# Patient Record
Sex: Male | Born: 1970 | Race: White | Hispanic: No | Marital: Single | Smoking: Current every day smoker
Health system: Southern US, Community
[De-identification: ages and names within clinical notes are randomized; demographics above are authoritative.]

## PROBLEM LIST (undated history)

## (undated) DIAGNOSIS — E7800 Pure hypercholesterolemia, unspecified: Secondary | ICD-10-CM

## (undated) DIAGNOSIS — M459 Ankylosing spondylitis of unspecified sites in spine: Secondary | ICD-10-CM

## (undated) HISTORY — PX: ROTATOR CUFF REPAIR: SHX139

## (undated) HISTORY — PX: KNEE SURGERY: SHX244

---

## 2005-04-21 ENCOUNTER — Emergency Department: Payer: Self-pay | Admitting: General Practice

## 2012-09-25 ENCOUNTER — Emergency Department: Payer: Self-pay | Admitting: Emergency Medicine

## 2012-11-07 ENCOUNTER — Ambulatory Visit: Payer: Self-pay | Admitting: Orthopedic Surgery

## 2012-11-11 ENCOUNTER — Ambulatory Visit: Payer: Self-pay | Admitting: Orthopedic Surgery

## 2013-09-15 ENCOUNTER — Ambulatory Visit: Payer: Self-pay | Admitting: Internal Medicine

## 2014-05-14 NOTE — Op Note (Signed)
PATIENT NAME:  Ian Fischer, Ian Fischer MR#:  [id number] DATE OF BIRTH:  28-May-1970  DATE OF PROCEDURE:  11/11/2012  PREOPERATIVE DIAGNOSIS:  Right shoulder rotator cuff tear, biceps tenotomy, and impingement syndrome.  POSTOPERATIVE DIAGNOSIS:  Right shoulder rotator cuff tear, biceps tenotomy, and impingement syndrome.  PROCEDURE PERFORMED:  Arthroscopic repair of rotator cuff including subscapularis repair. Subacromial decompression. Extensive synovectomy. Biceps tenotomy.   SURGEON:  Murlean Hark, M.D.   ASSISTANT:  Sonny Dandy, PA.   ANESTHESIA:  Interscalene block and general anesthesia.   ESTIMATED BLOOD LOSS:  Minimal.  IMPLANTS USED:  Arthrex bead bridge set, Arthrex BioComposite SwiveLock, suture anchor and 4.75 mm.   SURGICAL FINDINGS:  Arthroscopy of the right shoulder revealed full-thickness tear of the supraspinatus and anterior one-half of the infraspinatus. A high-grade partial thickness tear of the subscapularis was also noted. Significant inflammation and fraying of the biceps tendon was noted. Extensive bursitis with a large spur was noted in the subacromial space.   COMPLICATIONS:  No immediate postoperative or intraoperative or postoperative complications were noted.   INDICATIONS FOR PROCEDURE:  Ian Fischer is a 44 year old gentleman who sustained an injury to the right shoulder while at work. Evaluation with MRI and physical examination demonstrates a full-thickness rotator cuff tear. The risks and benefits of surgery, as well as the expected course of rehabilitation were explained to the patient in the office. The patient agrees to proceed with the procedure.   DESCRIPTION OF PROCEDURE:  Ian Fischer was identified in the preoperative holding area. The right shoulder was identified and marked as the operative site. Informed consent was reviewed. Questions from the patient and his wife were answered.   The patient was brought into the Operating Room, and  interscalene block was administered. The patient was placed on the Operating Room table in a supine position. General anesthesia was administered. The patient was placed in a beach chair position with padding of appropriate prominences and adequate securing of the head and body. The right upper extremity was prepared and draped in the usual sterile fashion.   A surgical time-out performed.   A standard posterior viewing portal was made. Arthroscope was inserted. Under direct visualization, an anterior portal was made in the rotator interval. A probe was inserted anteriorly and the biceps tendon was drawn into the joint. The biceps tendon demonstrated significant fraying and significant synovitis. The subscapularis was evaluated. There was a very high-grade tear of the subscapularis tendon. There was some scarring of the anterior capsule of the shoulder. The supraspinatus and infraspinatus were now evaluated. They demonstrated a full-thickness tear of the supraspinatus with retraction to the midportion of the humeral head. There was a full-thickness tear of the anterior half of the infraspinatus. The teres minor were appeared intact. The axillary recess was evaluated and no loose bodies were identified. The labrum was evaluated and had some mild fraying but no of acute tearing.   A lateral portal was made directly to the lateral rotator cuff tear. Through the anterior portal, a cannula was placed. Using a lasso, a FiberTape stitch is passed through a healthy portion of the subscapularis tendon after tendon debridement of the torn portion is undertaken. A horizontal mattress stitch is passed. A 4.75 SwiveLock anchor is inserted into the proximal humerus in line with the subscapularis footprint. The subscapularis was nicely reduced to the footprint and the BioComposite SwiveLock is completely deployed and tightened.   Attention was now turned to the subacromial space. There is quite extensive bursitis.  A shaver  was inserted laterally and a very extensive bursectomy is carried out. The rotator cuff tear is readily identified. The humeral footprint was cleaned. The patient does have an extensive history of smoking. Multiple microfracture pores are made in  the humeral surface after a burr used to expose healthier bone. Arthroscopic flow is turned off and there is some bleeding achieved.   An acromionizer burr is inserted and an extensive acromioplasty is carried out.   At this time, an anterior medial anchor is placed and a spliced FiberTape suture is passed through the anterior portion of the cuff. A posterior medial anchor is placed in a similar fashion and a second spliced FiberTape is passed through the posterior one-half of the tendon. The central core stitch at the posterior anchor is then passed in a horizontal mattress fashion through the posterior-most aspect of the cuff. All sutures are retrieved through the anterior portal. The spliced suture ends are cut. One limb from each of the FiberTape sutures is taken back out through the lateral portal, and fed through a SwiveLock anchor. The SwiveLock anchor is deployed in  the anterior half of the lateral row. In a similar fashion, the posterior lateral row anchor is passed using the residual two FiberTape limbs. The second anchor did not appear to achieve sure enough purchase and therefore the anchor was changed to a 5.5 mm width anchor and excellent purchase was achieved. The posterior horizontal mattress stitch was tied, nicely reducing the posterior-most aspect of the cuff. At this time the shoulder was taken through a range of motion and the rotator cuff was found to be very nicely and securely reduced.   The arthroscope was inserted back into the glenohumeral joint for further evaluation of cuff repair. The cuff integrity was found to be intact of both the subscapularis and the supraspinatus/infraspinatus repair.   Irrigation was run through the shoulder  and the shoulder was drained. All instruments were removed. The portals were closed using 2-0 Vicryl and nylon suture. Sterile dressings were applied. A TENS unit was applied. Polar Care was applied. The patient was placed in a slingshot sling. He will begin physical therapy on Friday. He will follow up in the office this week.   ____________________________ Murlean HarkShalini Katelynd Blauvelt, MD sr:jm D: 11/14/2012 15:44:33 ET T: 11/14/2012 16:30:44 ET JOB#: [id number]  cc: Murlean HarkShalini Khalib Fendley, MD, <Dictator> Murlean HarkSHALINI Maybelle Depaoli MD ELECTRONICALLY SIGNED 11/27/2012 12:40

## 2014-07-28 IMAGING — CR DG SHOULDER 3+V*R*
1 series · 3 of 3 positions shown · non-contrast
Comparison: none

REASON FOR EXAM: pain
COMMENTS:

PROCEDURE:     DXR - DXR SHOULDER RIGHT COMPLETE  - September 25, 2012  [DATE]
RESULT:     Right shoulder images demonstrate no evidence of fracture,
dislocation or radiopaque foreign body.

[Series 1: w shoulder external right · 0.14mm/px · 3 of 3 slices shown]
[im 1/3]
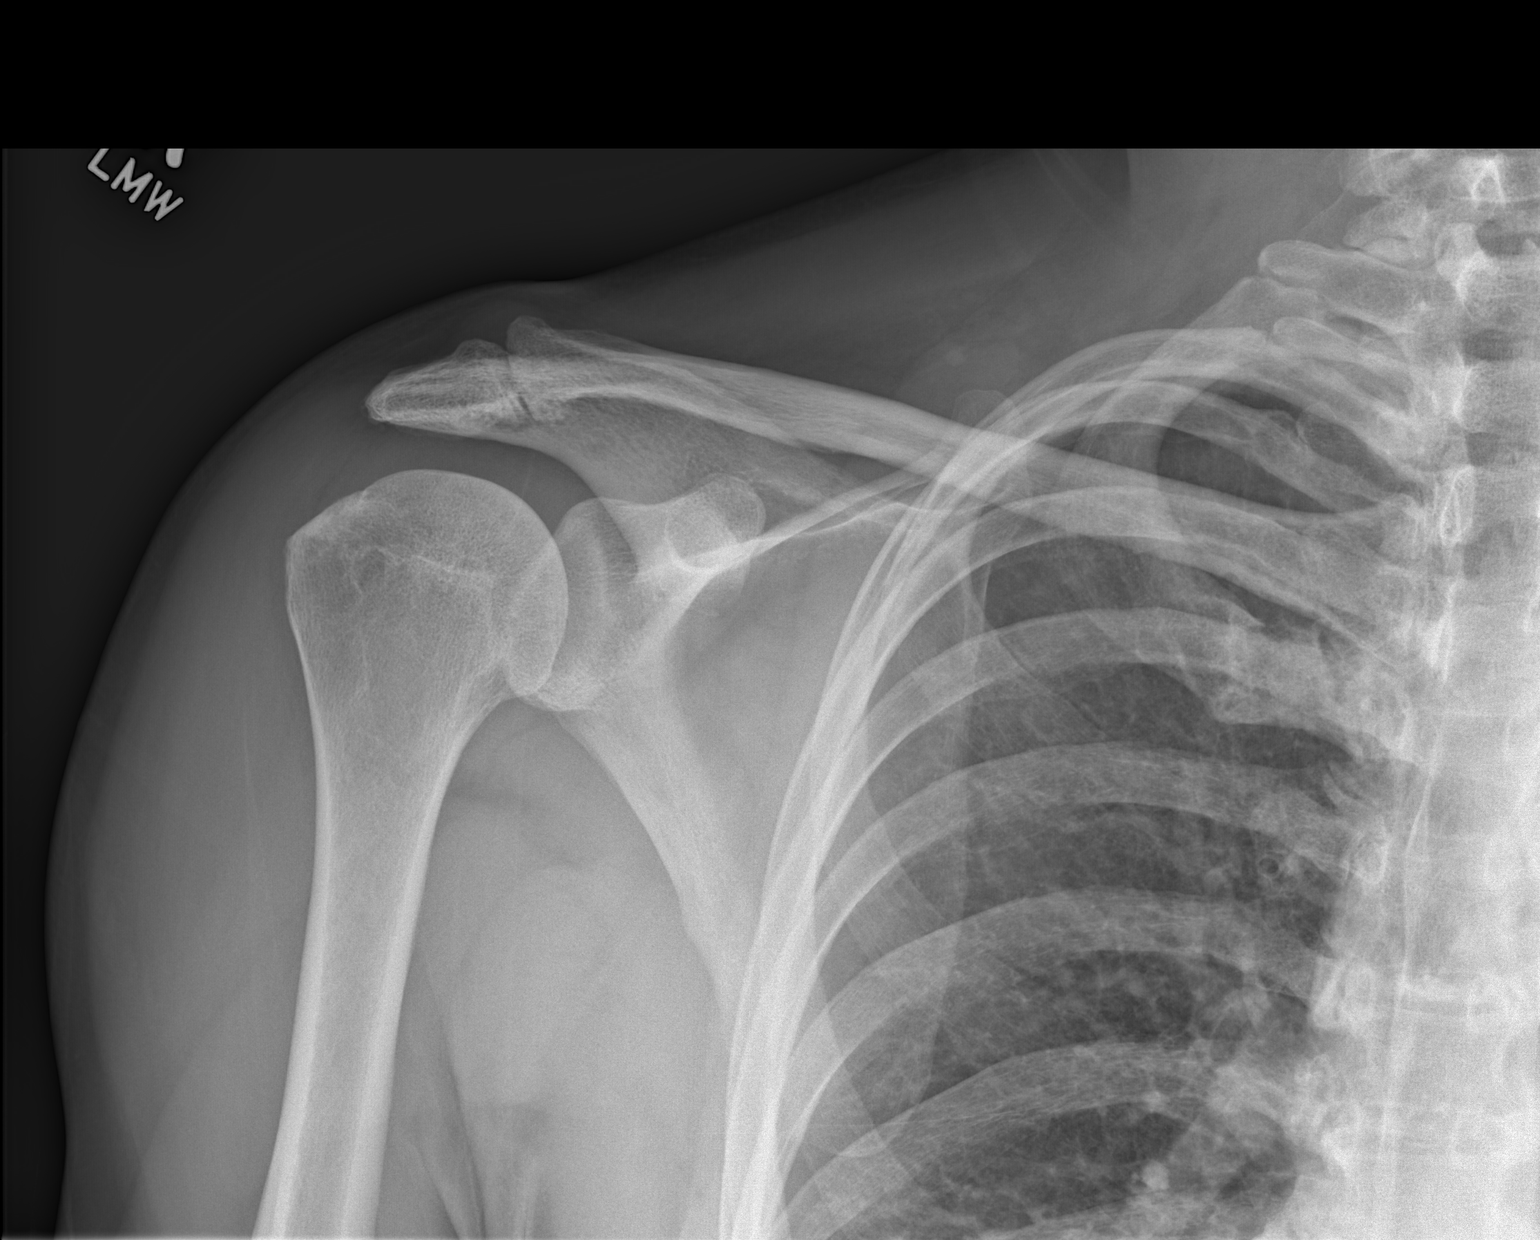
[im 2/3]
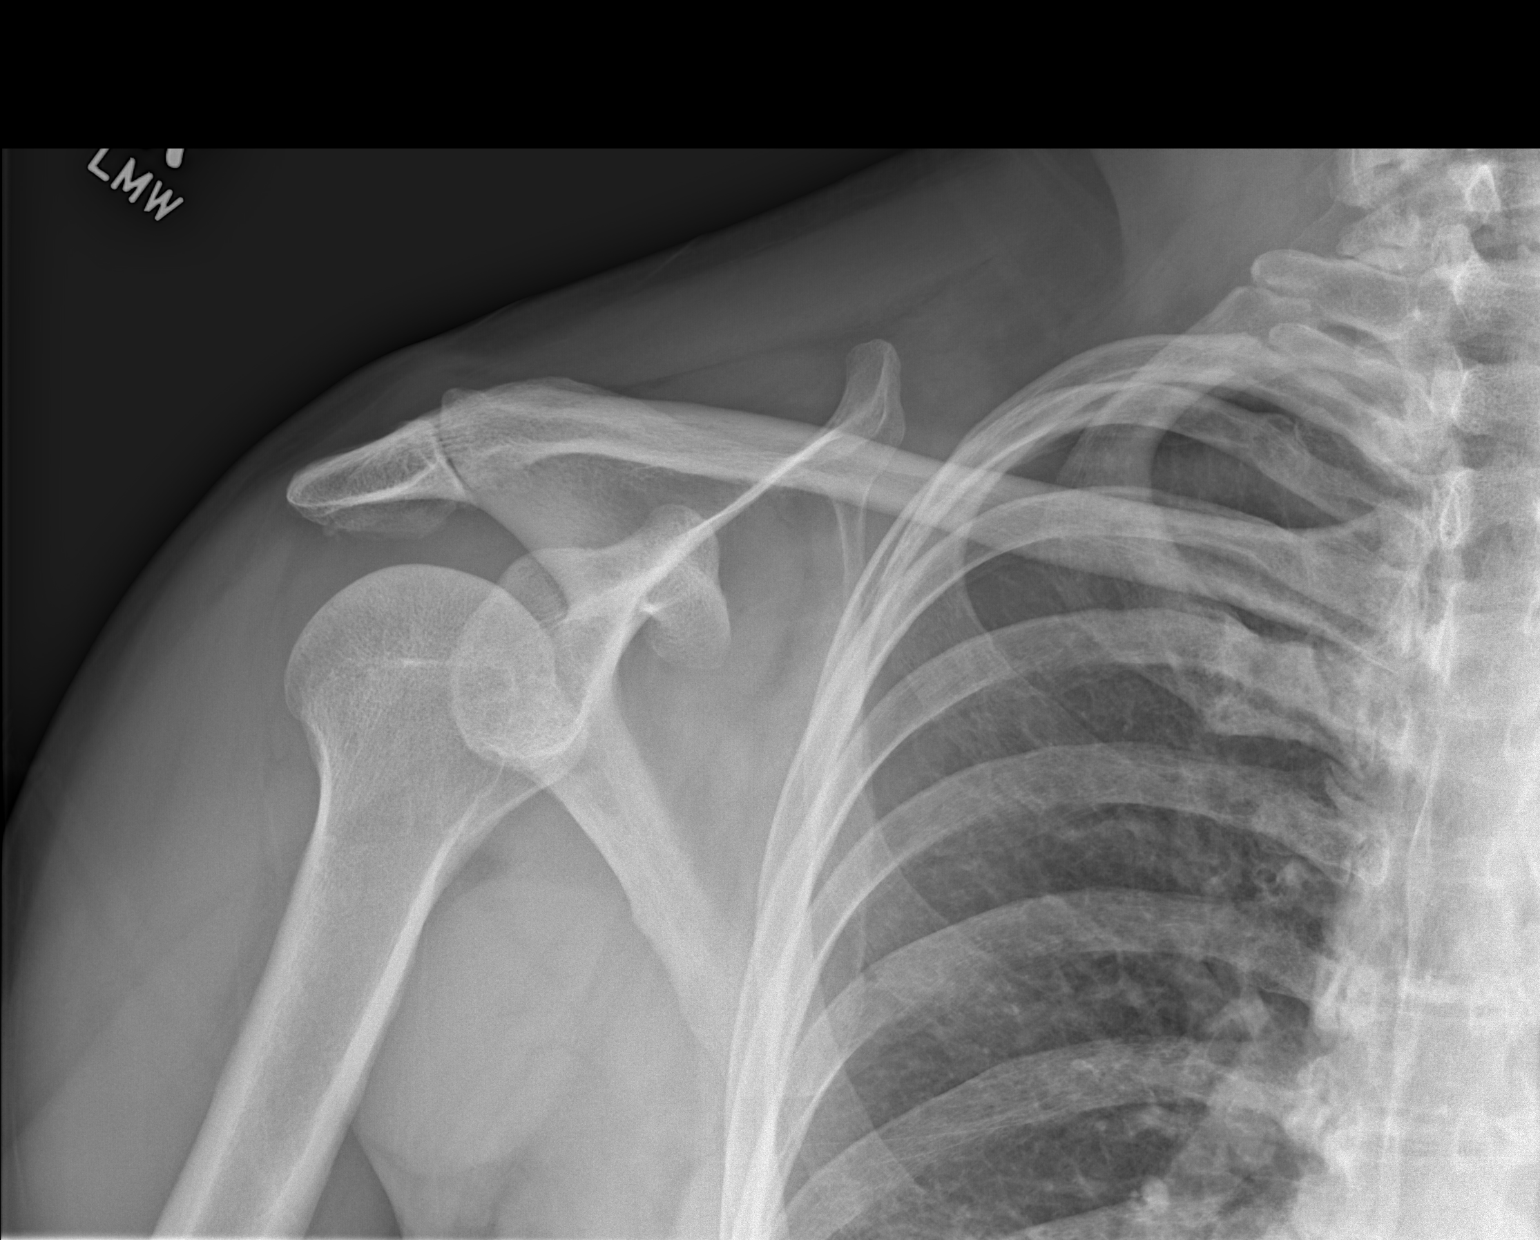
[im 3/3]
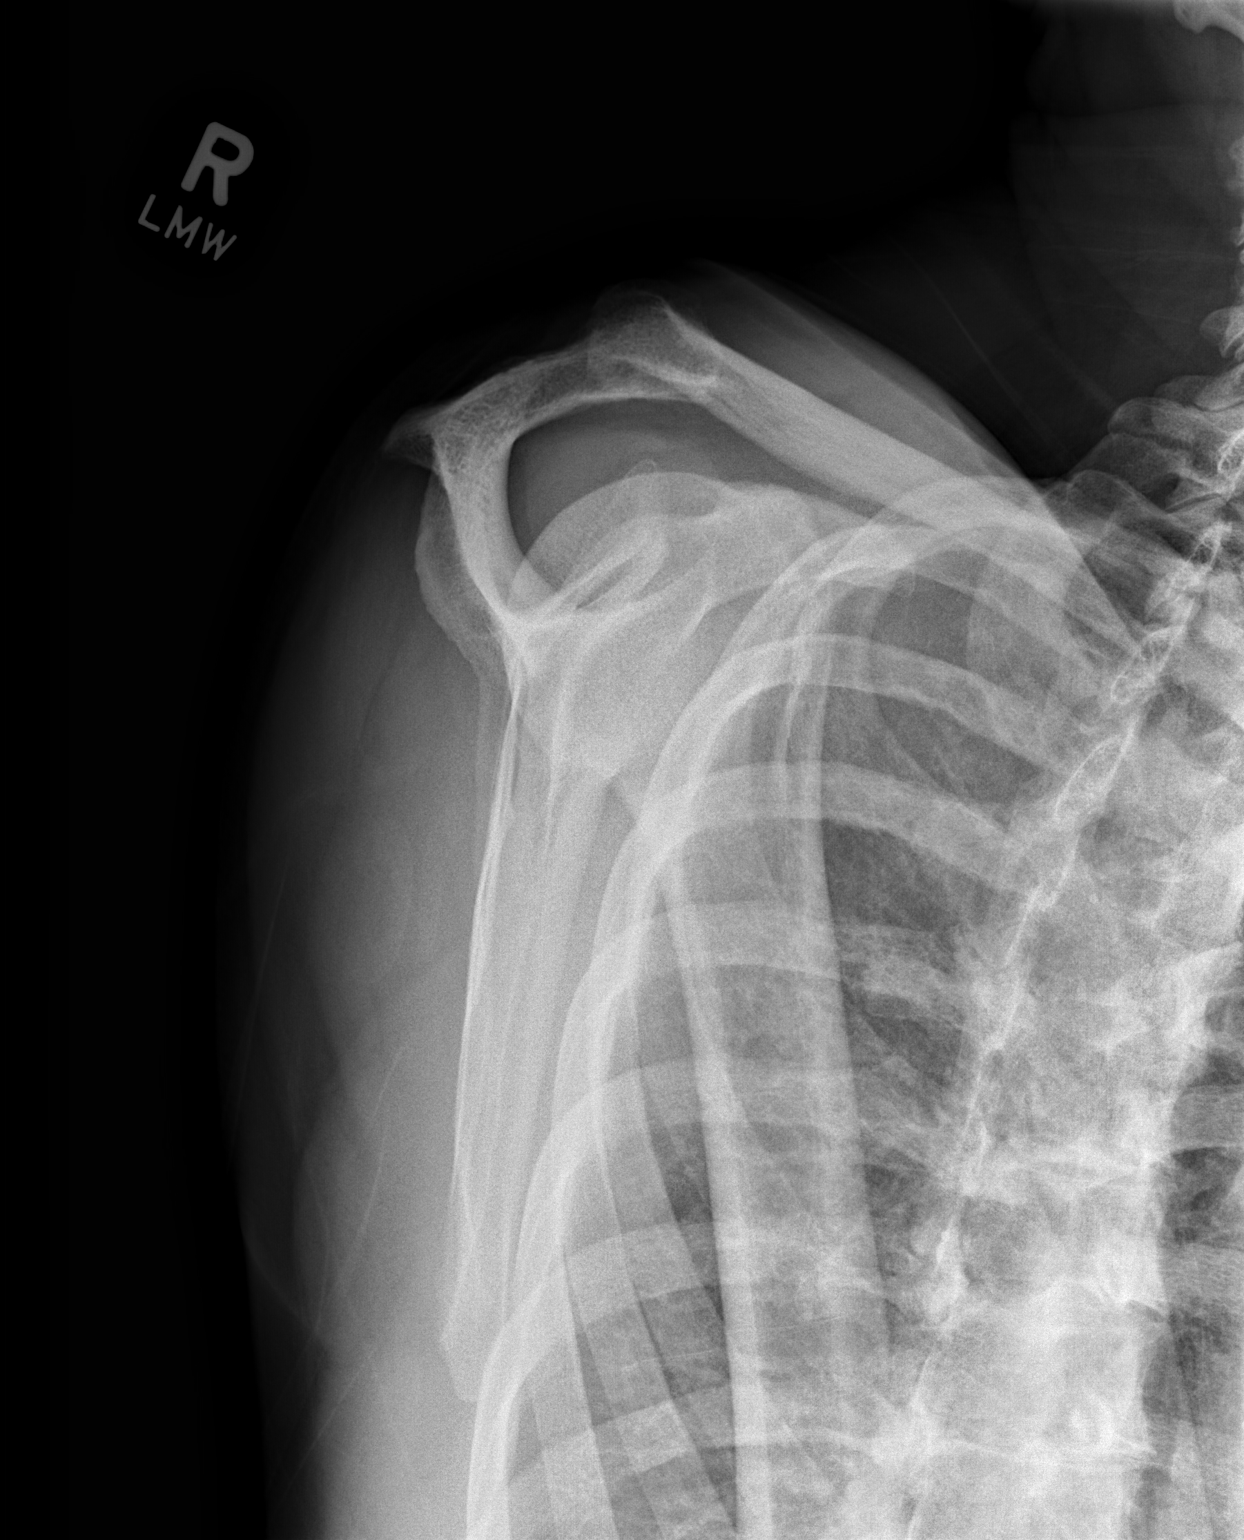

[3 of 3 positions shown; findings below may reference images not displayed]

IMPRESSION: Please see above.

[REDACTED]

## 2014-09-09 IMAGING — CR DG CHEST 2V
1 series · 2 of 2 positions shown · non-contrast
Comparison: none

REASON FOR EXAM: pre op clearance
COMMENTS:

[Series 1: w chest pa · 0.14mm/px · 2 of 2 slices shown]
[im 1/2]
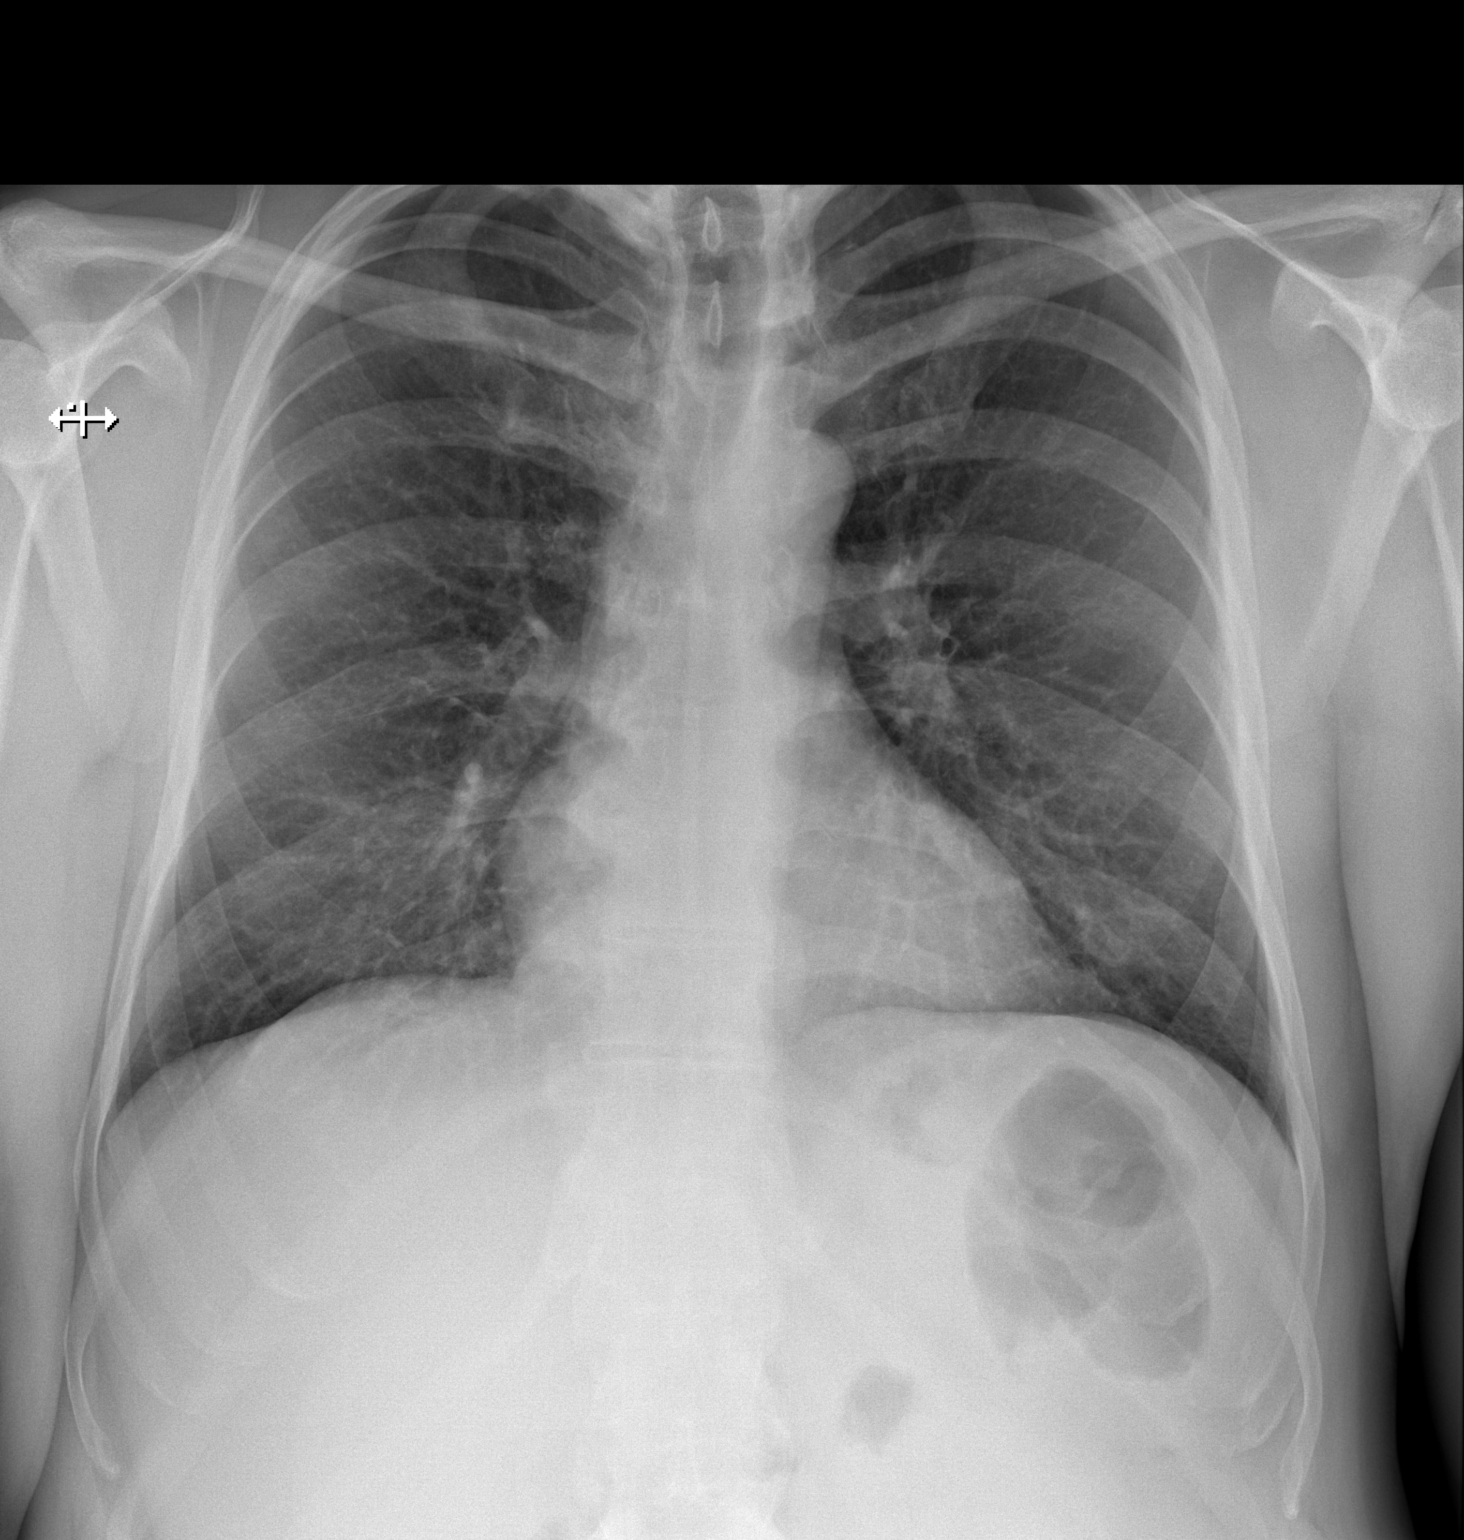
[im 2/2]
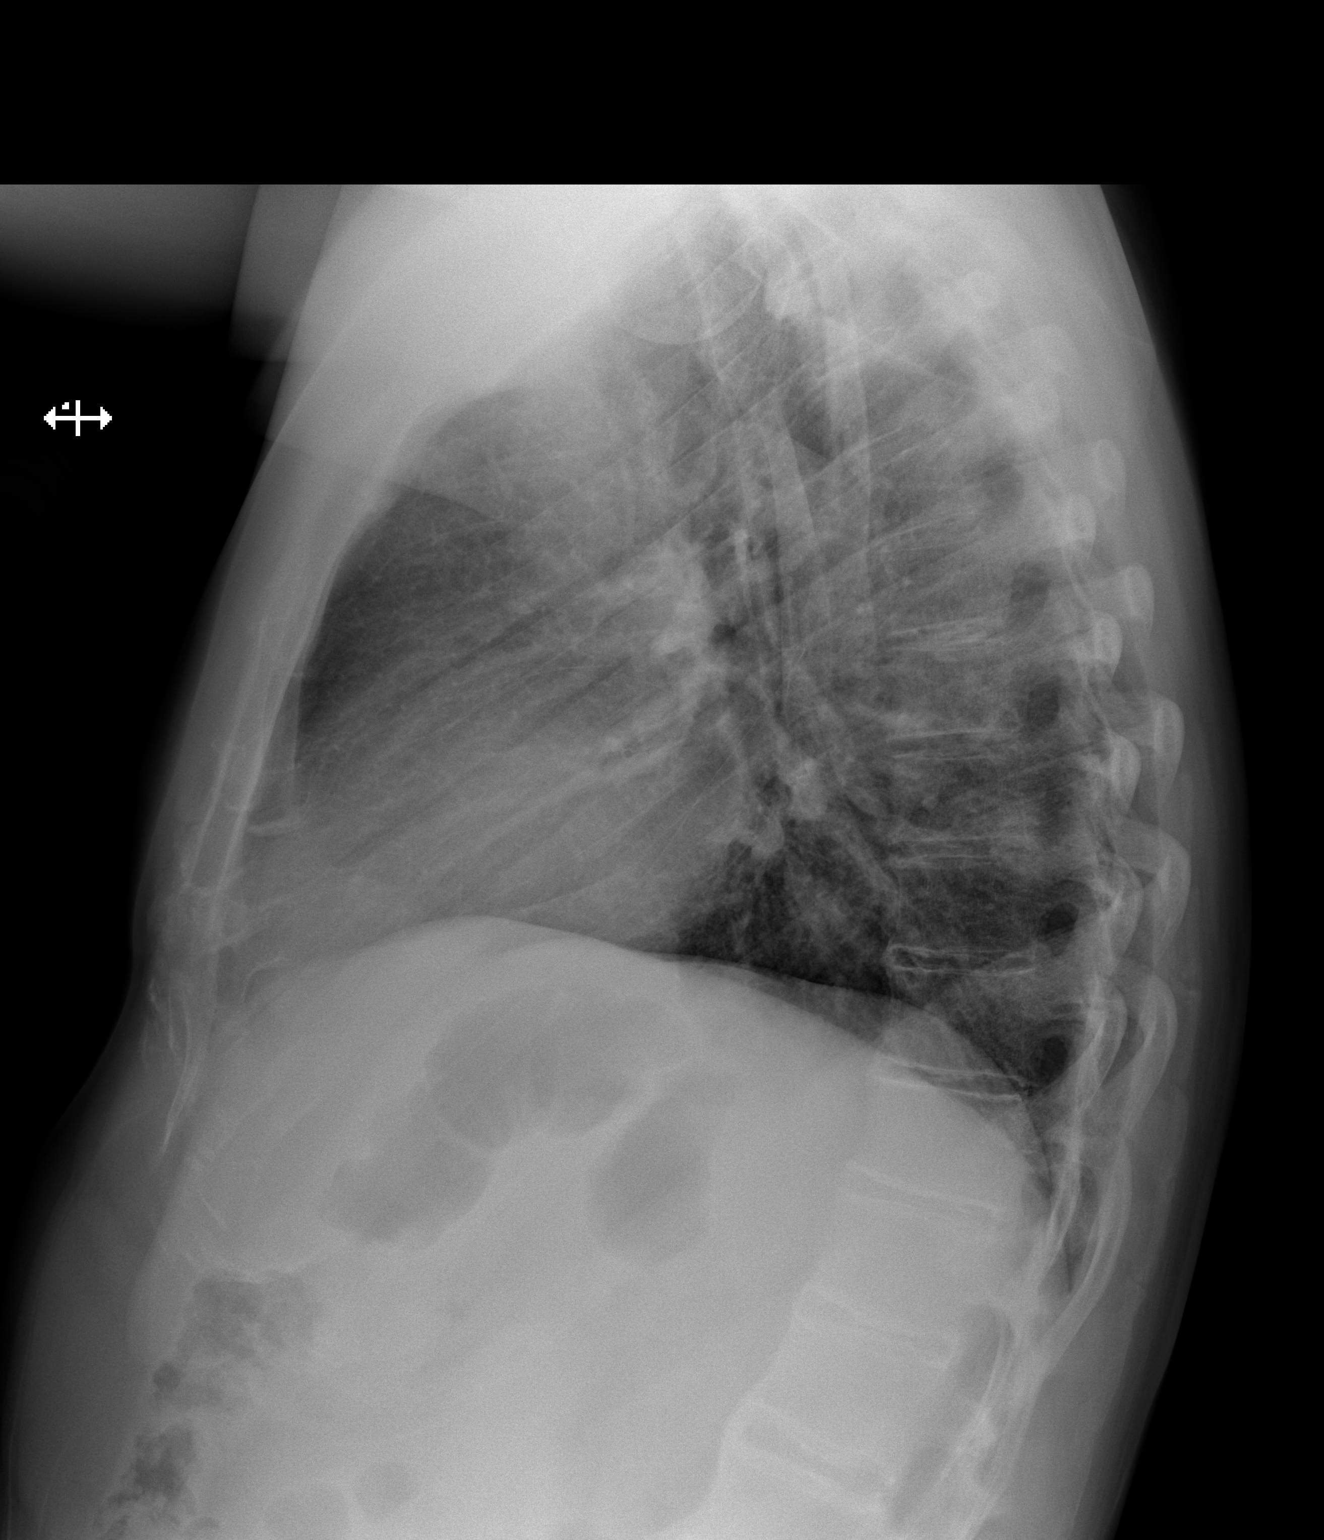

[2 of 2 positions shown; findings below may reference images not displayed]

PROCEDURE:     DXR - DXR CHEST PA (OR AP) AND LATERAL  - November 07, 2012 [DATE]

RESULT:     The lungs are adequately inflated. The interstitial markings are
mildly increased. The cardiac silhouette is normal in size. The pulmonary
vascularity is not engorged. There is no pleural effusion or pneumothorax.
The mediastinum is normal in width. The observed portions of the bony thorax
exhibit no acute abnormalities.
IMPRESSION: There is no evidence of pneumonia. Mildly increased
interstitial markings may reflect a smoking history if any. Alternatively
these could reflect minimal interstitial edema. There is no pulmonary
vascular congestion.

[REDACTED]

## 2014-09-30 ENCOUNTER — Emergency Department: Payer: BLUE CROSS/BLUE SHIELD

## 2014-09-30 ENCOUNTER — Emergency Department
Admission: EM | Admit: 2014-09-30 | Discharge: 2014-09-30 | Disposition: A | Discharge status: Home / Self Care | Payer: BLUE CROSS/BLUE SHIELD | Attending: Emergency Medicine | Admitting: Emergency Medicine

## 2014-09-30 ENCOUNTER — Encounter: Payer: Self-pay | Admitting: Emergency Medicine

## 2014-09-30 DIAGNOSIS — S6991XA Unspecified injury of right wrist, hand and finger(s), initial encounter: Secondary | ICD-10-CM | POA: Diagnosis present

## 2014-09-30 DIAGNOSIS — Y998 Other external cause status: Secondary | ICD-10-CM | POA: Insufficient documentation

## 2014-09-30 DIAGNOSIS — Z79899 Other long term (current) drug therapy: Secondary | ICD-10-CM | POA: Insufficient documentation

## 2014-09-30 DIAGNOSIS — S60221A Contusion of right hand, initial encounter: Secondary | ICD-10-CM | POA: Diagnosis not present

## 2014-09-30 DIAGNOSIS — Y9389 Activity, other specified: Secondary | ICD-10-CM | POA: Insufficient documentation

## 2014-09-30 DIAGNOSIS — W228XXA Striking against or struck by other objects, initial encounter: Secondary | ICD-10-CM | POA: Diagnosis not present

## 2014-09-30 DIAGNOSIS — Y9289 Other specified places as the place of occurrence of the external cause: Secondary | ICD-10-CM | POA: Diagnosis not present

## 2014-09-30 DIAGNOSIS — Z720 Tobacco use: Secondary | ICD-10-CM | POA: Diagnosis not present

## 2014-09-30 DIAGNOSIS — S63501A Unspecified sprain of right wrist, initial encounter: Secondary | ICD-10-CM | POA: Insufficient documentation

## 2014-09-30 DIAGNOSIS — Z791 Long term (current) use of non-steroidal anti-inflammatories (NSAID): Secondary | ICD-10-CM | POA: Diagnosis not present

## 2014-09-30 HISTORY — DX: Pure hypercholesterolemia, unspecified: E78.00

## 2014-09-30 MED ORDER — ETODOLAC 400 MG PO TABS: 400.0000 mg | ORAL_TABLET | Freq: Two times a day (BID) | ORAL | Status: AC

## 2014-09-30 NOTE — ED Provider Notes (Signed)
[facility] Emergency Department Provider Note  ____________________________________________  Time seen: Approximately 7:21 AM  I have reviewed the triage vital signs and the nursing notes.   HISTORY  Chief Complaint Hand Pain   HPI Ian Fischer is a 44 y.o. male is here with complaint of right hand pain. Patient gave 2 different stories 12 triage nurse stating that he was breaking up a fight between his dogs and the other 2 the flex nurse stating that he hit his hand on a counter a few days ago. Patient has continued pain. He also takesetodolac once a day for his arthritis. Patient points to the dorsal aspect of his right hand.   Past Medical History  Diagnosis Date  . Hypercholesteremia     There are no active problems to display for this patient.   Past Surgical History  Procedure Laterality Date  . Rotator cuff repair    . Knee surgery      Current Outpatient Rx  Name  Route  Sig  Dispense  Refill  . diclofenac (VOLTAREN) 50 MG EC tablet   Oral   Take 50 mg by mouth 2 (two) times daily.         Marland Kitchen gemfibrozil (LOPID) 600 MG tablet   Oral   Take 600 mg by mouth 2 (two) times daily before a meal.         . etodolac (LODINE) 400 MG tablet   Oral   Take 1 tablet (400 mg total) by mouth 2 (two) times daily.   20 tablet   0     Allergies Review of patient's allergies indicates no known allergies.  No family history on file.  Social History Social History  Substance Use Topics  . Smoking status: Current Every Day Smoker  . Smokeless tobacco: Never Used  . Alcohol Use: Yes    Review of Systems Constitutional: No fever/chills Eyes: No visual changes. ENT: No sore throat. Cardiovascular: Denies chest pain. Respiratory: Denies shortness of breath. Gastrointestinal:   No nausea, no vomiting.  Genitourinary: Negative for dysuria. Musculoskeletal: Negative for back pain. Right hand pain Skin: Negative for  rash. Neurological: Negative for headaches, focal weakness or numbness.  10-point ROS otherwise negative.  ____________________________________________   PHYSICAL EXAM:  VITAL SIGNS: ED Triage Vitals  Enc Vitals Group     BP 09/30/14 0631 147/95 mmHg     Pulse Rate 09/30/14 0631 99     Resp 09/30/14 0631 20     Temp 09/30/14 0631 97.6 F (36.4 C)     Temp Source 09/30/14 0631 Oral     SpO2 09/30/14 0631 96 %     Weight 09/30/14 0631 182 lb (82.555 kg)     Height 09/30/14 0631  (1.778 m)     Head Cir --      Peak Flow --      Pain Score --      Pain Loc --      Pain Edu? --      Excl. in GC? --     Constitutional: Alert and oriented. Well appearing and in no acute distress. Eyes: Conjunctivae are normal. PERRL. EOMI. Head: Atraumatic. Nose: No congestion/rhinnorhea. Neck: No stridor.   Cardiovascular: Normal rate, regular rhythm. Grossly normal heart sounds.  Good peripheral circulation. Respiratory: Normal respiratory effort.  No retractions. Lungs CTAB. Gastrointestinal: Soft and nontender. No distention. No abdominal bruits. No CVA tenderness. Musculoskeletal: Right hand dorsum no gross deformity noted, there is moderate tenderness  on palpation at the distal metacarpals. There is also discomfort with extension of the right wrist. No gross deformity of the wrist or edema. Motor sensory function intact No lower extremity tenderness nor edema.  No joint effusions. Neurologic:  Normal speech and language. No gross focal neurologic deficits are appreciated. No gait instability. Skin:  Skin is warm, dry and intact. No rash noted. No abrasions or ecchymosis is noted Psychiatric: Mood and affect are normal. Speech and behavior are normal.  ____________________________________________   LABS (all labs ordered are listed, but only abnormal results are displayed)  Labs Reviewed - No data to display RADIOLOGY  X-ray right hand shows osteoarthritic changes of the first  digit otherwise no fracture ____________________________________________   PROCEDURES  Procedure(s) performed: None  Critical Care performed: No  ____________________________________________   INITIAL IMPRESSION / ASSESSMENT AND PLAN / ED COURSE  Pertinent labs & imaging results that were available during my care of the patient were reviewed by me and considered in my medical decision making (see chart for details).  Patient was placed in a cockup wrist splint. He is also started on etodolac twice a day for pain. ____________________________________________   FINAL CLINICAL IMPRESSION(S) / ED DIAGNOSES  Final diagnoses:  Hand contusion, right, initial encounter  Wrist sprain, right, initial encounter      Tommi Rumps, PA-C 09/30/14 1400  Arnaldo Natal, MD 09/30/14 [phone]

## 2014-09-30 NOTE — ED Notes (Signed)
Pt to ED with c/o right hand pain. Pt reports he was trying to break up a fight between his dogs and had a sudden onset of pain when he pulled back on one of the dogs collars. Top of pt hand is swollen and tender to touch. No other known injury. Denies being bitten.

## 2014-09-30 NOTE — Discharge Instructions (Signed)
Cryotherapy Cryotherapy is when you put ice on your injury. Ice helps lessen pain and puffiness (swelling) after an injury. Ice works the best when you start using it in the first 24 to 48 hours after an injury. HOME CARE  Put a dry or damp towel between the ice pack and your skin.  You may press gently on the ice pack.  Leave the ice on for no more than 10 to 20 minutes at a time.  Check your skin after 5 minutes to make sure your skin is okay.  Rest at least 20 minutes between ice pack uses.  Stop using ice when your skin loses feeling (numbness).  Do not use ice on someone who cannot tell you when it hurts. This includes small children and people with memory problems (dementia). GET HELP RIGHT AWAY IF:  You have white spots on your skin.  Your skin turns blue or pale.  Your skin feels waxy or hard.  Your puffiness gets worse. MAKE SURE YOU:   Understand these instructions.  Will watch your condition.  Will get help right away if you are not doing well or get worse. Document Released: 06/27/2007 Document Revised: 04/02/2011 Document Reviewed: 08/31/2010 ExitCare Patient Information 2015 [employer]. This information is not intended to replace advice given to you by your health care provider. Make sure you discuss any questions you have with your health care provider.  

## 2014-09-30 NOTE — ED Notes (Signed)
States he hit his hand on a counter a few days ago  Hand is swollen and tender. No deformity noted

## 2015-07-18 IMAGING — CR RIGHT FOOT COMPLETE - 3+ VIEW
3 series · 3 of 3 positions shown · non-contrast
Comparison: None

CLINICAL DATA: Foot pain began hurting 5 weeks ago after work,
history gout, questionable history of trauma, pain mostly at heads
of second and third metatarsals

EXAM:
RIGHT FOOT COMPLETE - 3+ VIEW

[foot ap]
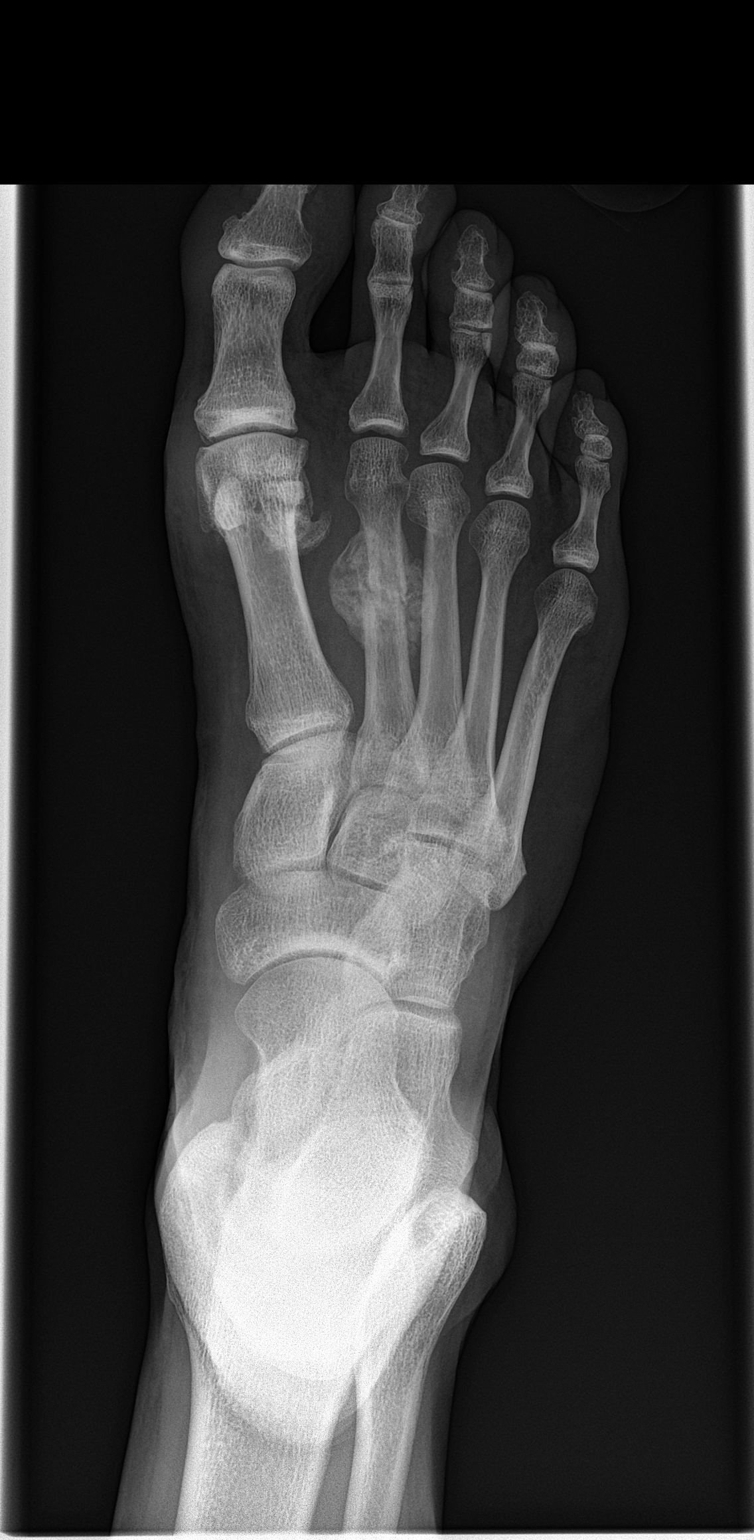

[foot obl]
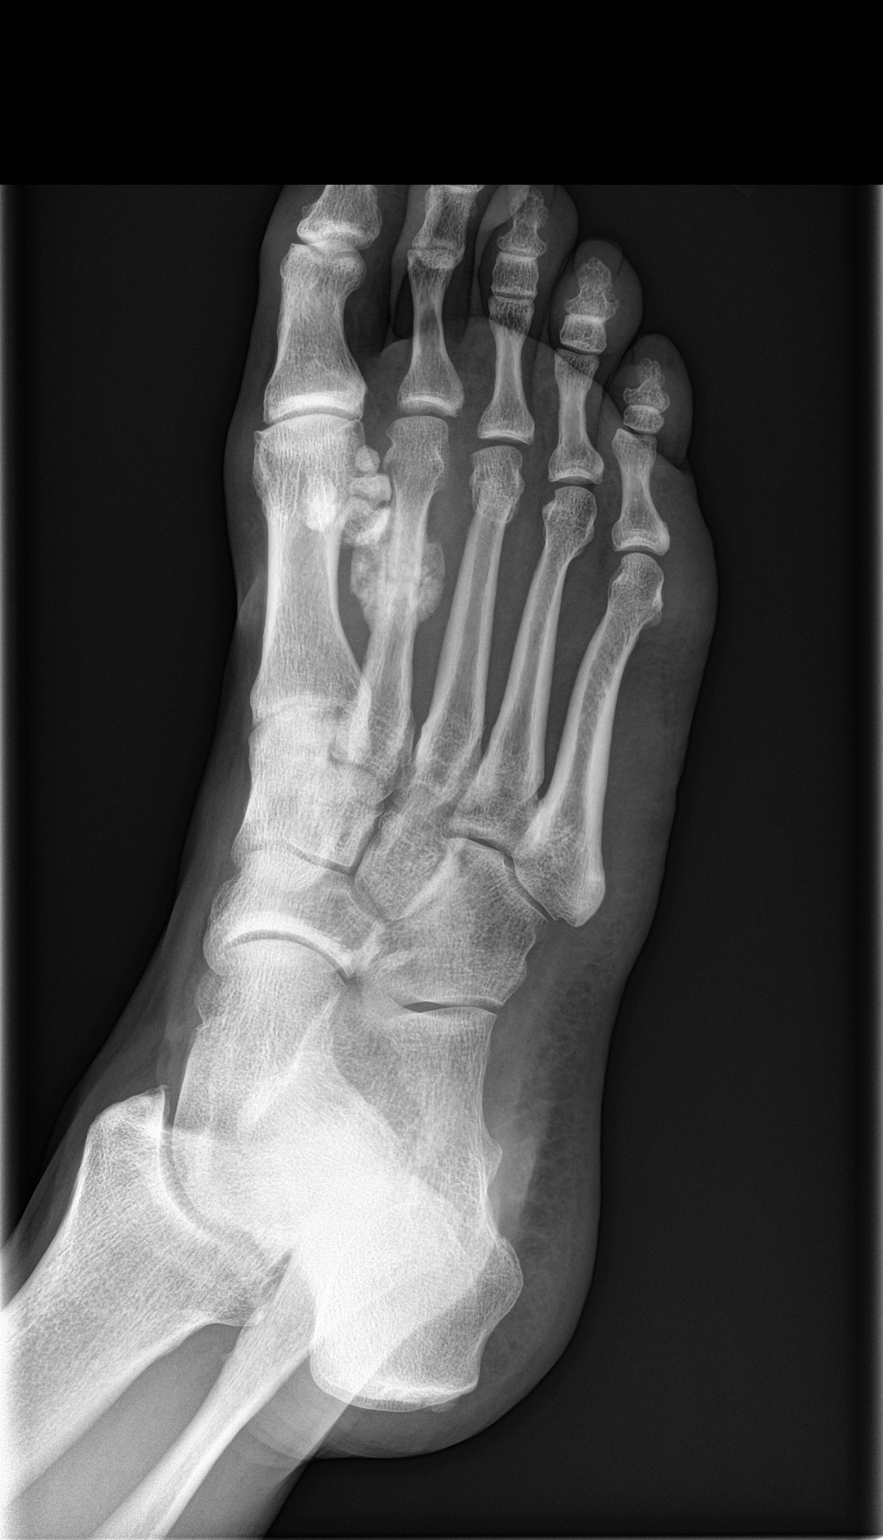

[foot lat]
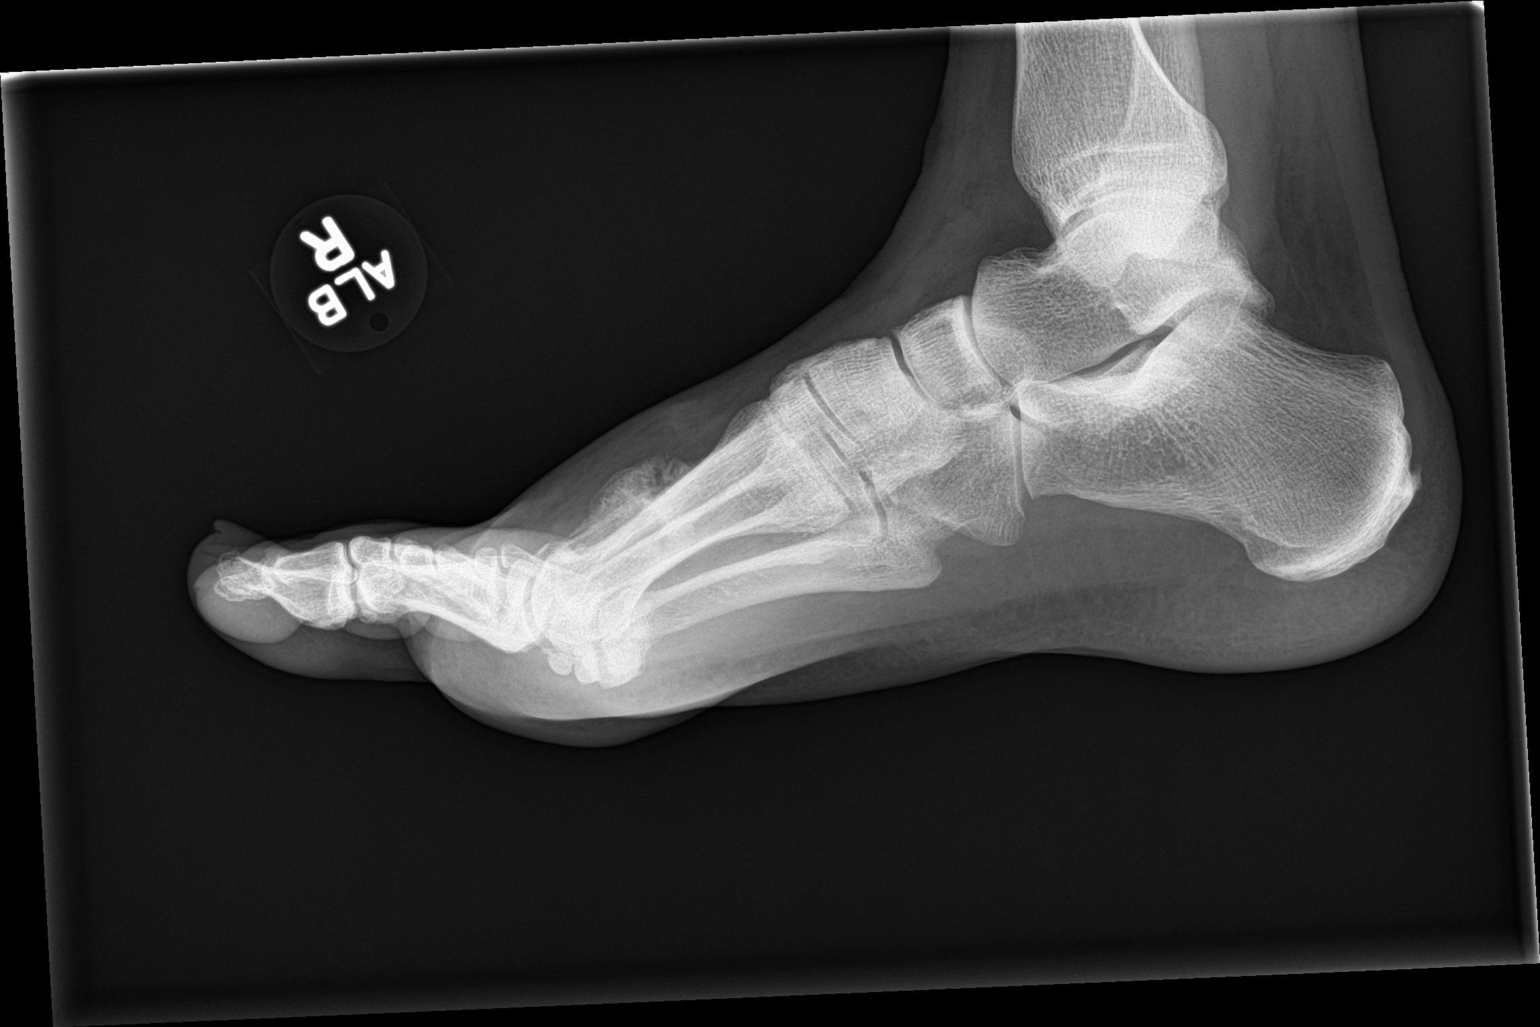

[3 of 3 positions shown; findings below may reference images not displayed]

FINDINGS: Osseous mineralization normal.

Joint spaces preserved.

Significant callous identified at a healing mid diaphyseal fracture
of the second metatarsal.

Deformity of the head of the third metatarsal compatible with
age-indeterminate fracture.

No additional fracture, dislocation or bone destruction.
IMPRESSION: Healing subacute fracture involving the midshaft of the RIGHT second
metatarsal.

Age-indeterminate fracture at head of RIGHT third metatarsal.

## 2018-02-05 ENCOUNTER — Ambulatory Visit
Admission: EM | Admit: 2018-02-05 | Discharge: 2018-02-05 | Disposition: A | Discharge status: Home / Self Care | Payer: Managed Care, Other (non HMO) | Attending: Family Medicine | Admitting: Family Medicine

## 2018-02-05 ENCOUNTER — Encounter: Payer: Self-pay | Admitting: Emergency Medicine

## 2018-02-05 DIAGNOSIS — Z720 Tobacco use: Secondary | ICD-10-CM

## 2018-02-05 DIAGNOSIS — R05 Cough: Secondary | ICD-10-CM | POA: Diagnosis not present

## 2018-02-05 DIAGNOSIS — J40 Bronchitis, not specified as acute or chronic: Secondary | ICD-10-CM | POA: Diagnosis not present

## 2018-02-05 DIAGNOSIS — R059 Cough, unspecified: Secondary | ICD-10-CM

## 2018-02-05 HISTORY — DX: Ankylosing spondylitis of unspecified sites in spine: M45.9

## 2018-02-05 MED ORDER — AZITHROMYCIN 250 MG PO TABS: ORAL_TABLET | ORAL | 0 refills | Status: AC

## 2018-02-05 MED ORDER — HYDROCOD POLST-CPM POLST ER 10-8 MG/5ML PO SUER: 5.0000 mL | Freq: Two times a day (BID) | ORAL | 0 refills | Status: AC | PRN

## 2018-02-05 NOTE — ED Triage Notes (Signed)
Pt c/o cough, nausea, vomiting, weakness, and shortness of breath. Started about a week ago.  Nausea and vomiting has resolved. Denies fever.

## 2018-02-05 NOTE — ED Provider Notes (Signed)
[facility]    CSN: [id number] Arrival date & time: 02/05/18  [id number]     History   Chief Complaint Chief Complaint  Patient presents with  . Cough    appt    HPI Ian Fischer is a 48 y.o. male.   The history is provided by the patient.  URI  Presenting symptoms: congestion and cough   Severity:  Moderate Onset quality:  Sudden Duration:  1 week Timing:  Constant Progression:  Worsening Chronicity:  New Relieved by:  None tried Ineffective treatments:  None tried Associated symptoms: no wheezing   Risk factors: sick contacts     Past Medical History:  Diagnosis Date  . AS (ankylosing spondylitis) (HCC)   . Hypercholesteremia     There are no active problems to display for this patient.   Past Surgical History:  Procedure Laterality Date  . KNEE SURGERY    . ROTATOR CUFF REPAIR         Home Medications    Prior to Admission medications   Medication Sig Start Date End Date Taking? Authorizing Provider  Adalimumab 40 MG/0.8ML PNKT Inject into the skin. 06/25/17  Yes [provider]  atorvastatin (LIPITOR) 80 MG tablet Take by mouth. 04/27/13  Yes [provider]  diclofenac (VOLTAREN) 50 MG EC tablet Take 50 mg by mouth 2 (two) times daily.   Yes [provider]  azithromycin (ZITHROMAX Z-PAK) 250 MG tablet 2 tabs po once day 1, then 1 tab po qd for next 4 days 02/05/18   Payton Mccallumonty, Aniqa Hare, MD  chlorpheniramine-HYDROcodone [facility](TUSSIONEX PENNKINETIC ER) 10-8 MG/5ML SUER Take 5 mLs by mouth every 12 (twelve) hours as needed. 02/05/18   Payton Mccallumonty, Shanti Agresti, MD  etodolac (LODINE) 400 MG tablet Take 1 tablet (400 mg total) by mouth 2 (two) times daily. 09/30/14   Tommi RumpsSummers, Rhonda L, PA-C  gemfibrozil (LOPID) 600 MG tablet Take 600 mg by mouth 2 (two) times daily before a meal.    [provider]    Family History Family History  Problem Relation Age of Onset  . Emphysema Mother   . Heart attack Father     Social  History Social History   Tobacco Use  . Smoking status: Current Every Day Smoker    Packs/day: 1.00  . Smokeless tobacco: Never Used  Substance Use Topics  . Alcohol use: Yes  . Drug use: Never     Allergies   Patient has no known allergies.   Review of Systems Review of Systems  HENT: Positive for congestion.   Respiratory: Positive for cough. Negative for wheezing.      Physical Exam Triage Vital Signs ED Triage Vitals  Enc Vitals Group     BP 02/05/18 1028 (!) 135/112     Pulse Rate 02/05/18 1028 100     Resp 02/05/18 1028 20     Temp 02/05/18 1028 98.3 F (36.8 C)     Temp Source 02/05/18 1028 Oral     SpO2 02/05/18 1028 97 %     Weight 02/05/18 1024 182 lb (82.6 kg)     Height 02/05/18 1024 5\' 10"  (1.778 m)     Head Circumference --      Peak Flow --      Pain Score 02/05/18 1024 0     Pain Loc --      Pain Edu? --      Excl. in GC? --    No data found.  Updated Vital Signs  BP (!) 135/112 (BP Location: Left Arm)   Pulse 100   Temp 98.3 F (36.8 C) (Oral)   Resp 20   Ht 5\' 10"  (1.778 m)   Wt 82.6 kg   SpO2 97%   BMI 26.11 kg/m   Visual Acuity Right Eye Distance:   Left Eye Distance:   Bilateral Distance:    Right Eye Near:   Left Eye Near:    Bilateral Near:     Physical Exam Vitals signs and nursing note reviewed.  Constitutional:      General: He is not in acute distress.    Appearance: He is well-developed. He is not toxic-appearing or diaphoretic.  HENT:     Head: Normocephalic and atraumatic.     Right Ear: Tympanic membrane, ear canal and external ear normal.     Left Ear: Tympanic membrane, ear canal and external ear normal.     Nose: Nose normal.     Mouth/Throat:     Pharynx: Uvula midline. No oropharyngeal exudate.     Tonsils: No tonsillar abscesses.  Eyes:     General: No scleral icterus.       Right eye: No discharge.        Left eye: No discharge.  Neck:     Musculoskeletal: Normal range of motion and neck supple.       Thyroid: No thyromegaly.     Trachea: No tracheal deviation.  Cardiovascular:     Rate and Rhythm: Normal rate and regular rhythm.     Heart sounds: Normal heart sounds.  Pulmonary:     Effort: Pulmonary effort is normal. No respiratory distress.     Breath sounds: Normal breath sounds. No stridor. No wheezing, rhonchi or rales.  Chest:     Chest wall: No tenderness.  Lymphadenopathy:     Cervical: No cervical adenopathy.  Skin:    General: Skin is warm and dry.     Findings: No rash.  Neurological:     Mental Status: He is alert.      UC Treatments / Results  Labs (all labs ordered are listed, but only abnormal results are displayed) Labs Reviewed - No data to display  EKG None  Radiology No results found.  Procedures Procedures (including critical care time)  Medications Ordered in UC Medications - No data to display  Initial Impression / Assessment and Plan / UC Course  I have reviewed the triage vital signs and the nursing notes.  Pertinent labs & imaging results that were available during my care of the patient were reviewed by me and considered in my medical decision making (see chart for details).      Final Clinical Impressions(s) / UC Diagnoses   Final diagnoses:  Cough  Bronchitis    ED Prescriptions    Medication Sig Dispense Auth. Provider   azithromycin (ZITHROMAX Z-PAK) 250 MG tablet 2 tabs po once day 1, then 1 tab po qd for next 4 days 6 each Payton Mccallum, MD   chlorpheniramine-HYDROcodone (TUSSIONEX PENNKINETIC ER) 10-8 MG/5ML SUER Take 5 mLs by mouth every 12 (twelve) hours as needed. 60 mL Payton Mccallum, MD     1. diagnosis reviewed with patient 2. rx as per orders above; reviewed possible side effects, interactions, risks and benefits  3. Recommend supportive treatment with rest, fluids 4. Follow-up prn if symptoms worsen or don't improve   Controlled Substance Prescriptions [place] Controlled Substance Registry consulted? Not  Applicable   Payton Mccallum, MD 02/05/18 1146

## 2019-03-15 ENCOUNTER — Ambulatory Visit: Payer: Managed Care, Other (non HMO) | Attending: Internal Medicine

## 2019-03-15 DIAGNOSIS — Z23 Encounter for immunization: Secondary | ICD-10-CM

## 2019-03-15 NOTE — Progress Notes (Signed)
   Covid-19 Vaccination Clinic  Name:  Ian Fischer    MRN: [id number] DOB: 1970-04-21  03/15/2019  Mr. Sample was observed post Covid-19 immunization for 15 minutes without incidence. He was provided with Vaccine Information Sheet and instruction to access the V-Safe system.   [name] was instructed to call 911 with any severe reactions post vaccine: Marland Kitchen Difficulty breathing  . Swelling of your face and throat  . A fast heartbeat  . A bad rash all over your body  . Dizziness and weakness    Immunizations Administered    Name Date Dose VIS Date Route   Pfizer COVID-19 Vaccine 03/15/2019  1:02 PM 0.3 mL 01/02/2019 Intramuscular   Manufacturer: [employer], [employer]   Lot: [id number]   NDC: [address]-6

## 2019-04-08 ENCOUNTER — Ambulatory Visit: Payer: Managed Care, Other (non HMO) | Attending: Internal Medicine

## 2019-04-08 DIAGNOSIS — Z23 Encounter for immunization: Secondary | ICD-10-CM

## 2019-04-08 NOTE — Progress Notes (Signed)
   Covid-19 Vaccination Clinic  Name:  Ian Fischer    MRN: [id number] DOB: 1970/08/30  04/08/2019  [name] was observed post Covid-19 immunization for 15 minutes without incident. He was provided with Vaccine Information Sheet and instruction to access the V-Safe system.   [name] was instructed to call 911 with any severe reactions post vaccine: Marland Kitchen Difficulty breathing  . Swelling of face and throat  . A fast heartbeat  . A bad rash all over body  . Dizziness and weakness   Immunizations Administered    Name Date Dose VIS Date Route   Pfizer COVID-19 Vaccine 04/08/2019  4:29 PM 0.3 mL 01/02/2019 Intramuscular   Manufacturer: [employer], [employer]   Lot: [id number]   NDC: [address]-4

## 2023-09-11 ENCOUNTER — Other Ambulatory Visit: Payer: Self-pay | Admitting: Medical Genetics

## 2023-09-16 ENCOUNTER — Other Ambulatory Visit: Payer: Self-pay

## 2023-11-15 ENCOUNTER — Other Ambulatory Visit: Payer: Self-pay | Admitting: Medical Genetics

## 2023-11-15 DIAGNOSIS — Z006 Encounter for examination for normal comparison and control in clinical research program: Secondary | ICD-10-CM
